# Patient Record
Sex: Female | Born: 1960 | Race: Black or African American | Hispanic: No | Marital: Married | Smoking: Never smoker
Health system: Southern US, Community
[De-identification: ages and names within clinical notes are randomized; demographics above are authoritative.]

## PROBLEM LIST (undated history)

## (undated) DIAGNOSIS — M7500 Adhesive capsulitis of unspecified shoulder: Secondary | ICD-10-CM

## (undated) DIAGNOSIS — F419 Anxiety disorder, unspecified: Secondary | ICD-10-CM

## (undated) DIAGNOSIS — T7840XA Allergy, unspecified, initial encounter: Secondary | ICD-10-CM

## (undated) HISTORY — PX: ABDOMINAL HYSTERECTOMY: SHX81

## (undated) HISTORY — DX: Adhesive capsulitis of unspecified shoulder: M75.00

## (undated) HISTORY — PX: POLYPECTOMY: SHX149

## (undated) HISTORY — DX: Anxiety disorder, unspecified: F41.9

## (undated) HISTORY — DX: Allergy, unspecified, initial encounter: T78.40XA

## (undated) HISTORY — PX: COLONOSCOPY: SHX174

---

## 2005-01-15 ENCOUNTER — Other Ambulatory Visit: Admission: RE | Admit: 2005-01-15 | Discharge: 2005-01-15 | Payer: Self-pay | Admitting: Obstetrics and Gynecology

## 2005-09-27 ENCOUNTER — Encounter (INDEPENDENT_AMBULATORY_CARE_PROVIDER_SITE_OTHER): Payer: Self-pay | Admitting: Specialist

## 2005-09-27 ENCOUNTER — Ambulatory Visit (HOSPITAL_COMMUNITY): Admission: RE | Admit: 2005-09-27 | Discharge: 2005-09-28 | Payer: Self-pay | Admitting: Obstetrics and Gynecology

## 2007-03-08 ENCOUNTER — Ambulatory Visit (HOSPITAL_BASED_OUTPATIENT_CLINIC_OR_DEPARTMENT_OTHER): Admission: RE | Admit: 2007-03-08 | Discharge: 2007-03-08 | Payer: Self-pay | Admitting: Orthopedic Surgery

## 2010-08-11 NOTE — Op Note (Signed)
Kathryn Pace, KNUPP                ACCOUNT NO.:  [id number]   MEDICAL RECORD NO.:  [id number]          PATIENT TYPE:  AMB   LOCATION:  DSC                          FACILITY:  [facility]   PHYSICIAN:  Cindee Salt, M.D.       DATE OF BIRTH:  02/20/1961   DATE OF PROCEDURE:  03/08/2007  DATE OF DISCHARGE:                               OPERATIVE REPORT   PREOPERATIVE DIAGNOSIS:  Carpal tunnel syndrome, right hand.   POSTOPERATIVE DIAGNOSIS:  Carpal tunnel syndrome, right hand.   OPERATION:  Decompression right median nerve.   SURGEON:  Dr. Merlyn Lot.   ANESTHESIA:  Forearm based IV regional.   HISTORY:  The patient is a 50 year old female with a history of carpal  tunnel syndrome, EMG nerve conductions positive and nonresponsive to  conservative treatment.  She is desirous of having this surgically  released.  She is aware of the risks and complications including  infection, recurrence, injury to arteries, nerves, tendons, incomplete  relief of symptoms, dystrophy. In the preoperative area, the patient is  seen, the extremity marked by both the patient and surgeon.  Antibiotic  given.   DESCRIPTION OF PROCEDURE:  The patient is brought to the operating room  where a forearm based IV regional anesthetic was carried out without  difficulty.  She was prepped using DuraPrep, supine position, right arm  free.  A longitudinal incision was made in the palm, carried down  through the subcutaneous tissue.  Bleeders were electrocauterized.  The  palmar fascia was split, superficial palmar arch identified, the flexor  tendon to the ring and little finger identified to the ulnar side of the  median nerve. The carpal retinaculum was incised with sharp dissection.  A right angle and Sewall retractor were placed between skin and forearm  fascia.  The fascia released for approximately a centimeter and a half  proximal to the wrist crease under direct vision.  The canal was  explored.  No further lesions  were identified.  Compression of the nerve  was immediately apparent.  The wound was irrigated.  Skin closed with  interrupted 5-0 Vicryl Rapide sutures.  A sterile compressive dressing  and splint with the fingers free was applied.  The patient tolerated the  procedure well and was taken to the recovery  room for observation in  satisfactory condition.  She is discharged home to return to the [facility] in 1 week on Vicodin.           ______________________________  Cindee Salt, M.D.     GK/MEDQ  D:  03/08/2007  T:  03/09/2007  Job:  [id number]

## 2010-08-14 NOTE — H&P (Signed)
NAMEODELLA, APPELHANS                ACCOUNT NO.:  [id number]   MEDICAL RECORD NO.:  [id number]          PATIENT TYPE:  AMB   LOCATION:  SDC                           FACILITY:  [facility]   PHYSICIAN:  Huel Cote, M.D. DATE OF BIRTH:  1960/08/30   DATE OF ADMISSION:  09/27/2005  DATE OF DISCHARGE:                                HISTORY & PHYSICAL   Dictating for surgery to take place at the [facility] facility at 9:30  a.m. on September 27, 2005.   Patient is a 50 year old G2, P1 who is coming in for a scheduled  laparoscopic-assisted vaginal hysterectomy given ongoing problems with  severe menorrhagia and flooding related to uterine fibroids.  The patient  had tried medical management with oral contraceptives, trying different  strengths, however, really received no significant relief and as well had  begun to have increasing pain with her periods.  The patient has no  significant past medical history.  She has no previous surgical history.  Gynecologically, no abnormal Pap smears.   PAST OBSTETRICAL HISTORY:  Significant for one miscarriage and one vaginal  delivery of an 8 pound 13 ounce infant.   ALLERGIES:  NONE.   MEDICATIONS:  Include Yaz birth control pills and hydrochlorothiazide for  some slightly elevated blood pressures.   PHYSICAL EXAM:  Patient's weight is 176 pounds, blood pressure 112/85.  CARDIAC EXAM:  Regular rate and rhythm.  LUNGS:  Clear.  ABDOMEN:  Soft and nontender.  PELVIC EXAM:  Patient's cervix is noted to be anterior.  Uterus is  retroverted and nodular, approximately 7-8 weeks in size, and the adnexa  have no significant masses.  Ultrasound report confirmed that the patient  had multiple uterine fibroids, intramural and possible submucosal, and her  ovaries appeared normal.  Patient was counseled as to the risks and benefits  of surgery including bleeding and infection and possible damage to bowel and  bladder and desired to proceed with  hysterectomy.  All possible options were  discussed with the patient including hysteroscopy vs. laparoscopic  supracervical hysterectomy and vaginal hysterectomy.  The patient wishes to  retain her ovaries if they are normal and desired her cervix to be removed.  Therefore, the plan was made for a laparoscopic assisted vaginal  hysterectomy given that the patient's uterine descensus is fair and  considerably retroverted.  All risks and benefits of the surgery were  discussed with the patient in detail including bleeding and infection and  possible damage to bowel and bladder.  The patient understands that should  any complications arise such as adjacent organ  damage or bleeding that she may require an abdominal incision, which will  require a longer healing process, and she understands the situation and  agrees that that should be performed in that event.  After all of these  options and risks and benefits were reviewed with the patient, she desired  to proceed as stated.      Huel Cote, M.D.  Electronically Signed     KR/MEDQ  D:  09/25/2005  T:  09/25/2005  Job:  [id number]

## 2010-08-14 NOTE — Op Note (Signed)
NAMELILYA, SMITHERMAN                ACCOUNT NO.:  [id number]   MEDICAL RECORD NO.:  [id number]          PATIENT TYPE:  OIB   LOCATION:  9315                          FACILITY:  [facility]   PHYSICIAN:  Huel Cote, M.D. DATE OF BIRTH:  Apr 14, 1960   DATE OF PROCEDURE:  09/27/2005  DATE OF DISCHARGE:                                 OPERATIVE REPORT   PREOPERATIVE DIAGNOSIS:  1.  Menorrhagia.  2.  Fibroid uterus.   POSTOPERATIVE DIAGNOSIS:  1.  Menorrhagia.  2.  Fibroid uterus.   PROCEDURE:  1.  Laparoscopic-assisted vaginal hysterectomy.  2.  Cystoscopy.   SURGEON:  Dr. Huel Cote.   ASSISTANT:  Zenaida Niece, M.D.   ANESTHESIA:  General.   SPECIMENS:  Uterus and cervix were sent.   ESTIMATED BLOOD LOSS:  400 mL.   URINE OUTPUT:  400 mL.   IV FLUIDS:  2400 mL LR.   FINDINGS:  The uterus was 10 weeks in size, approximately 190 grams. Ovaries  and tubes were normal.  There were multiple fibroids noted.   SPECIMEN:  The uterus was sent to pathology.   PROCEDURE:  The patient was taken to the operating room where general  anesthesia was obtained without difficulty.  She was then prepped and draped  in normal sterile fashion in dorsal lithotomy position with a speculum  placed. Hulka tenaculum was placed within the cervix for uterine  manipulation and the Foley catheter was placed in the bladder.  Attention  was then turned to the patient's abdomen where after injection with quarter  percent Marcaine a small infraumbilical incision was made with scalpel and  the Veress needle introduced intraperitoneal placement was confirmed with  both aspiration and injection with normal saline. Gas flow was then applied  and normal pressure noted at approximately 4 therefore pneumoperitoneum was  obtained with approximately 2.5 liters CO2 gas. Two additional trocars were  placed at each mid quadrant.  After injection with quarter percent Marcaine  under direct visualization.  With 5 mm trocars in place.  Attention was then  turned to the uterus itself.  The ovaries and tubes were inspected and found  to be normal.  Therefore the harmonic scalpel was utilized to take down the  utero-ovarian ligament as well as the fallopian tube and broad ligament and  round ligament on the patient's left. There was small area of bleeding noted  which was controlled with harmonic scalpel. Attention was then turned to the  patient's right where again the utero-ovarian and the round ligament and  fallopian tube as well as the broad ligament were taken down with harmonic  scalpel.  This was taken down to the level of the bladder flap which was  created across the midline.  There was slow trickle of bleeding that had  continued near the uterine artery side on the patient's left however, it was  very slow trickle and therefore as much hemostasis could be obtained was  obtained and no active bleeding noted. At this point decision was made to go  to the vaginal approach.  All instruments were removed  from the trocars and  they were left in place.  These were covered and the patient legs elevated.  With a weighted speculum in place, a Jacobson tenaculums were placed.  The  Hulka tenaculum and pulled cervix downward in traction. Solution of dilute  Pitressin 20 units in 100 mL was then injected around the cervix and the  cervix was then incised circumferentially with the Bovie cautery.  The Mayo  scissors were then utilized to dissect the vaginal mucosa off the underlying  cervix.  The posterior cul-de-sac was entered sharply with Mayo scissors and  a small clot evacuated. Attention was then turned to the right uterosacral  ligament which was clamped with Zeppelin clamp, transected and suture  ligated with 0 Vicryl.  A similar fashion on the patient's right the  uterosacral ligament was clamped, transected and suture ligated. The  anterior cul-de-sac was then identified and entered  bluntly.  With the  banana speculum then in the posterior cul-de-sac and a Deaver retractor in  the anterior cul-de-sac.  The uterus was isolated. The remainder of the  pedicle on the patient's left was then clamped, transected and suture  ligated with 0 Vicryl.  There was some bleeding noted to appear to be coming  from the patient's left. This was felt to be due more to the uterus back  bleeding as the arteries had not been clamped on the patient's right.  Therefore the right paracervical tissue and uterine artery were sequentially  clamped with Zeppelin clamp, transected and suture ligated.  This did appear  to improve the bleeding.  This was continued until the pedicles were  completely transected. The uterus itself was then delivered and handed off  to pathology. There was small amount of bleeding still noted on either side  which was controlled with figure-of-eight sutures of 0 Vicryl and a figure-  of-eight suture of 3-0 Vicryl. These were mostly at the angles of the cuff.  This was then well-controlled and no active bleeding noted.  Therefore the  Bonanno speculum was removed from the patient's vagina and the posterior  cuff was identified as it was bleeding some. It was suture ligated in a  running locked suture along the posterior cuff to gain hemostasis with 0  Vicryl.  This did appear to gain hemostasis of the posterior cuff. The  uterosacral ligaments were then reapproximated with 0 Vicryl in an  interrupted suture and all sutures trimmed. The vaginal cuff itself was then  closed with 2-0 Vicryl in a running locked suture.  At this point, excellent  hemostasis was noted.  The decision was made to proceed with cystoscopy just  given the bleeding that had been encountered to ensure that the ureters had  not been compromised with controlling any bleeding. Indigo carmine had been  given IV and the cystoscopy 7 degrees scope was introduced into the bladder with the bladder drained.   The ureteral orifices were both clearly  identified and had normal ureteral jets noted coming from each side. Bladder  itself appeared traumatized and there were no sutures apparent.  Therefore  the cystoscope was removed and the Foley catheter replaced into the urethra.  At this point the attention was then returned to the patient's abdomen.  The  gas flow was then reapplied and pneumoperitoneum obtained.  There was some  old blood clot noted in the pelvis which was irrigated and all pedicles were  irrigated.  The ovarian pedicles appeared hemostatic.  There is no active  bleeding at the cuff.  Therefore all instruments and sponges were removed  from the patient's abdomen.  The 5 mm trocars were removed under direct  visualization.  The pneumoperitoneum reduced and a 10:11 removed. The 10/11  trocar site was then closed with one deep suture of 0 Vicryl and  subcuticular stitch of 3-0 Vicryl and the 5 mm sites were closed with one  suture each of mattress suture of 3-0 Vicryl.  Sponge, lap and needle counts  were correct x2 and the patient was taken to the recovery room extubated in  stable condition.      Huel Cote, M.D.  Electronically Signed     KR/MEDQ  D:  09/27/2005  T:  09/27/2005  Job:  [id number]

## 2010-09-21 ENCOUNTER — Other Ambulatory Visit: Payer: Self-pay | Admitting: Obstetrics and Gynecology

## 2010-09-21 DIAGNOSIS — Z1231 Encounter for screening mammogram for malignant neoplasm of breast: Secondary | ICD-10-CM

## 2010-09-24 ENCOUNTER — Ambulatory Visit: Payer: Self-pay

## 2010-09-29 ENCOUNTER — Ambulatory Visit: Payer: Self-pay

## 2010-10-29 ENCOUNTER — Ambulatory Visit
Admission: RE | Admit: 2010-10-29 | Discharge: 2010-10-29 | Disposition: A | Discharge status: Home / Self Care | Payer: BC Managed Care – PPO | Source: Ambulatory Visit | Attending: Obstetrics and Gynecology | Admitting: Obstetrics and Gynecology

## 2010-10-29 DIAGNOSIS — Z1231 Encounter for screening mammogram for malignant neoplasm of breast: Secondary | ICD-10-CM

## 2011-01-04 LAB — POCT HEMOGLOBIN-HEMACUE: Operator id: 208731

## 2011-11-03 ENCOUNTER — Other Ambulatory Visit: Payer: Self-pay | Admitting: Obstetrics and Gynecology

## 2011-11-03 DIAGNOSIS — Z1231 Encounter for screening mammogram for malignant neoplasm of breast: Secondary | ICD-10-CM

## 2011-11-09 ENCOUNTER — Ambulatory Visit: Payer: BC Managed Care – PPO

## 2011-12-06 ENCOUNTER — Ambulatory Visit
Admission: RE | Admit: 2011-12-06 | Discharge: 2011-12-06 | Disposition: A | Discharge status: Home / Self Care | Payer: BC Managed Care – PPO | Source: Ambulatory Visit | Attending: Obstetrics and Gynecology | Admitting: Obstetrics and Gynecology

## 2011-12-06 DIAGNOSIS — Z1231 Encounter for screening mammogram for malignant neoplasm of breast: Secondary | ICD-10-CM

## 2013-02-02 ENCOUNTER — Other Ambulatory Visit: Payer: Self-pay

## 2013-02-02 DIAGNOSIS — Z1231 Encounter for screening mammogram for malignant neoplasm of breast: Secondary | ICD-10-CM

## 2013-02-05 ENCOUNTER — Ambulatory Visit
Admission: RE | Admit: 2013-02-05 | Discharge: 2013-02-05 | Disposition: A | Discharge status: Home / Self Care | Payer: BC Managed Care – PPO | Source: Ambulatory Visit

## 2013-02-05 DIAGNOSIS — Z1231 Encounter for screening mammogram for malignant neoplasm of breast: Secondary | ICD-10-CM

## 2014-02-13 ENCOUNTER — Other Ambulatory Visit: Payer: Self-pay

## 2014-02-13 DIAGNOSIS — Z1231 Encounter for screening mammogram for malignant neoplasm of breast: Secondary | ICD-10-CM

## 2014-02-28 ENCOUNTER — Ambulatory Visit
Admission: RE | Admit: 2014-02-28 | Discharge: 2014-02-28 | Disposition: A | Discharge status: Home / Self Care | Payer: BC Managed Care – PPO | Source: Ambulatory Visit

## 2014-02-28 ENCOUNTER — Encounter (INDEPENDENT_AMBULATORY_CARE_PROVIDER_SITE_OTHER): Payer: Self-pay

## 2014-02-28 DIAGNOSIS — Z1231 Encounter for screening mammogram for malignant neoplasm of breast: Secondary | ICD-10-CM

## 2015-05-19 ENCOUNTER — Other Ambulatory Visit: Payer: Self-pay

## 2015-05-19 DIAGNOSIS — Z1231 Encounter for screening mammogram for malignant neoplasm of breast: Secondary | ICD-10-CM

## 2015-06-03 ENCOUNTER — Ambulatory Visit
Admission: RE | Admit: 2015-06-03 | Discharge: 2015-06-03 | Disposition: A | Discharge status: Home / Self Care | Payer: BC Managed Care – PPO | Source: Ambulatory Visit

## 2015-06-03 DIAGNOSIS — Z1231 Encounter for screening mammogram for malignant neoplasm of breast: Secondary | ICD-10-CM

## 2016-07-26 ENCOUNTER — Other Ambulatory Visit: Payer: Self-pay | Admitting: Obstetrics and Gynecology

## 2016-07-26 DIAGNOSIS — Z1231 Encounter for screening mammogram for malignant neoplasm of breast: Secondary | ICD-10-CM

## 2016-07-28 ENCOUNTER — Ambulatory Visit
Admission: RE | Admit: 2016-07-28 | Discharge: 2016-07-28 | Disposition: A | Discharge status: Home / Self Care | Payer: BC Managed Care – PPO | Source: Ambulatory Visit | Attending: Obstetrics and Gynecology | Admitting: Obstetrics and Gynecology

## 2016-07-28 DIAGNOSIS — Z1231 Encounter for screening mammogram for malignant neoplasm of breast: Secondary | ICD-10-CM

## 2017-08-03 ENCOUNTER — Other Ambulatory Visit: Payer: Self-pay | Admitting: Obstetrics and Gynecology

## 2017-08-03 DIAGNOSIS — Z1231 Encounter for screening mammogram for malignant neoplasm of breast: Secondary | ICD-10-CM

## 2017-08-24 ENCOUNTER — Ambulatory Visit
Admission: RE | Admit: 2017-08-24 | Discharge: 2017-08-24 | Disposition: A | Discharge status: Home / Self Care | Payer: BC Managed Care – PPO | Source: Ambulatory Visit | Attending: Obstetrics and Gynecology | Admitting: Obstetrics and Gynecology

## 2017-08-24 ENCOUNTER — Other Ambulatory Visit: Payer: Self-pay | Admitting: Obstetrics and Gynecology

## 2017-08-24 DIAGNOSIS — Z1231 Encounter for screening mammogram for malignant neoplasm of breast: Secondary | ICD-10-CM

## 2020-01-22 ENCOUNTER — Other Ambulatory Visit: Payer: Self-pay | Admitting: Obstetrics and Gynecology

## 2020-01-22 DIAGNOSIS — Z1231 Encounter for screening mammogram for malignant neoplasm of breast: Secondary | ICD-10-CM

## 2020-01-31 ENCOUNTER — Other Ambulatory Visit: Payer: Self-pay

## 2020-01-31 ENCOUNTER — Telehealth: Payer: Self-pay

## 2020-01-31 ENCOUNTER — Ambulatory Visit (AMBULATORY_SURGERY_CENTER): Payer: Self-pay

## 2020-01-31 VITALS — Ht 66.0 in | Wt 224.8 lb

## 2020-01-31 DIAGNOSIS — Z86010 Personal history of colonic polyps: Secondary | ICD-10-CM

## 2020-01-31 MED ORDER — CLENPIQ 10-3.5-12 MG-GM -GM/160ML PO SOLN: 1.0000 | Freq: Once | ORAL | 0 refills | Status: AC

## 2020-01-31 NOTE — Progress Notes (Signed)
No allergies to soy or egg Pt is not on blood thinners or diet pills Denies issues with sedation/intubation Denies atrial flutter/fib Denies constipation   Emmi instructions given to pt  Pt is aware of Covid safety and care partner requirements.   Denies any issues with kidney functions or HTN.

## 2020-01-31 NOTE — Telephone Encounter (Signed)
In closing chart it was noted that local drug store not confirmed.  Attempted to reach pt to get this information with call going to VM.  I requested pt to call back with that information.

## 2020-02-08 ENCOUNTER — Other Ambulatory Visit: Payer: Self-pay

## 2020-02-08 ENCOUNTER — Ambulatory Visit (AMBULATORY_SURGERY_CENTER): Payer: BC Managed Care – PPO | Admitting: Gastroenterology

## 2020-02-08 ENCOUNTER — Encounter: Payer: Self-pay | Admitting: Gastroenterology

## 2020-02-08 VITALS — BP 132/83 | HR 86 | Temp 97.0°F | Resp 17 | Ht 66.0 in | Wt 224.8 lb

## 2020-02-08 DIAGNOSIS — Z86010 Personal history of colonic polyps: Secondary | ICD-10-CM | POA: Diagnosis not present

## 2020-02-08 DIAGNOSIS — Z1211 Encounter for screening for malignant neoplasm of colon: Secondary | ICD-10-CM

## 2020-02-08 MED ORDER — SODIUM CHLORIDE 0.9 % IV SOLN: 500.0000 mL | INTRAVENOUS | Status: DC

## 2020-02-08 NOTE — Progress Notes (Signed)
Pt's states no medical or surgical changes since previsit or office visit. 

## 2020-02-08 NOTE — Op Note (Signed)
[facility] Patient Name: Kathryn Pace Procedure Date: 02/08/2020 12:51 PM MRN: [id number] Endoscopist: Lynann Bologna , MD Age: 59 Referring MD:  Date of Birth: 1960/10/17 Gender: Female Account #: [id number] Procedure:                Colonoscopy Indications:              Colon cancer screening in patient at increased                            risk: Family history of colonic polyps. Medicines:                Monitored Anesthesia Care Procedure:                Pre-Anesthesia Assessment:                           - Prior to the procedure, a History and Physical                            was performed, and patient medications and                            allergies were reviewed. The patient's tolerance of                            previous anesthesia was also reviewed. The risks                            and benefits of the procedure and the sedation                            options and risks were discussed with the patient.                            All questions were answered, and informed consent                            was obtained. Prior Anticoagulants: The patient has                            taken no previous anticoagulant or antiplatelet                            agents. ASA Grade Assessment: II - A patient with                            mild systemic disease. After reviewing the risks                            and benefits, the patient was deemed in                            satisfactory condition to undergo the procedure.  After obtaining informed consent, the colonoscope                            was passed under direct vision. Throughout the                            procedure, the patient's blood pressure, pulse, and                            oxygen saturations were monitored continuously. The                            Colonoscope was introduced through the anus and                            advanced to the 2 cm  into the ileum. The                            colonoscopy was performed without difficulty. The                            patient tolerated the procedure well. The quality                            of the bowel preparation was good. The terminal                            ileum, ileocecal valve, appendiceal orifice, and                            rectum were photographed. Scope In: 1:38:49 PM Scope Out: 1:52:43 PM Scope Withdrawal Time: 0 hours 9 minutes 24 seconds  Total Procedure Duration: 0 hours 13 minutes 54 seconds  Findings:                 Multiple small-mouthed diverticula were found in                            the sigmoid colon and ascending colon.                           Non-bleeding internal hemorrhoids were found during                            retroflexion. The hemorrhoids were small.                           The terminal ileum appeared normal.                           The exam was otherwise without abnormality on                            direct and retroflexion views. Complications:            No immediate  complications. Estimated Blood Loss:     Estimated blood loss: none. Impression:               -Pancolonic diverticulosis predominantly in the                            sigmoid colon.                           -Otherwise normal colonoscopy to TI.                           -No specimens collected. Recommendation:           - Patient has a contact number available for                            emergencies. The signs and symptoms of potential                            delayed complications were discussed with the                            patient. Return to normal activities tomorrow.                            Written discharge instructions were provided to the                            patient.                           - Resume previous diet.                           - Continue present medications.                           - Repeat colonoscopy in 10  years for screening                            purposes. Earlier, if with any new problems or if                            there is any change in family history.                           - Return to GI office PRN. Lynann Bologna, MD 02/08/2020 1:58:40 PM This report has been signed electronically.

## 2020-02-08 NOTE — Patient Instructions (Signed)
HANDOUTS PROVIDED ON: DIVERTICULOSIS & HEMORRHOIDS   You may resume your previous diet and medication schedule.  Thank you for allowing us to care for you today!!!  YOU HAD AN ENDOSCOPIC PROCEDURE TODAY AT THE [facility]:   Refer to the procedure report that was given to you for any specific questions about what was found during the examination.  If the procedure report does not answer your questions, please call your gastroenterologist to clarify.  If you requested that your care partner not be given the details of your procedure findings, then the procedure report has been included in a sealed envelope for you to review at your convenience later.  YOU SHOULD EXPECT: Some feelings of bloating in the abdomen. Passage of more gas than usual.  Walking can help get rid of the air that was put into your GI tract during the procedure and reduce the bloating. If you had a lower endoscopy (such as a colonoscopy or flexible sigmoidoscopy) you may notice spotting of blood in your stool or on the toilet paper. If you underwent a bowel prep for your procedure, you may not have a normal bowel movement for a few days.  Please Note:  You might notice some irritation and congestion in your nose or some drainage.  This is from the oxygen used during your procedure.  There is no need for concern and it should clear up in a day or so.  SYMPTOMS TO REPORT IMMEDIATELY:   Following lower endoscopy (colonoscopy or flexible sigmoidoscopy):  Excessive amounts of blood in the stool  Significant tenderness or worsening of abdominal pains  Swelling of the abdomen that is new, acute  Fever of 100F or higher  For urgent or emergent issues, a gastroenterologist can be reached at any hour by calling [phone]. Do not use MyChart messaging for urgent concerns.    DIET:  We do recommend a small meal at first, but then you may proceed to your regular diet.  Drink plenty of fluids but you should avoid  alcoholic beverages for 24 hours.  ACTIVITY:  You should plan to take it easy for the rest of today and you should NOT DRIVE or use heavy machinery until tomorrow (because of the sedation medicines used during the test).    FOLLOW UP: Our staff will call the number listed on your records Tuesday morning between 7:15am and 8:15 am to check on you and address any questions or concerns that you may have regarding the information given to you following your procedure. If we do not reach you, we will leave a message.  We will attempt to reach you two times.  During this call, we will ask if you have developed any symptoms of COVID 19. If you develop any symptoms (ie: fever, flu-like symptoms, shortness of breath, cough etc.) before then, please call [phone].  If you test positive for Covid 19 in the 2 weeks post procedure, please call and report this information to us.    If any biopsies were taken you will be contacted by phone or by letter within the next 1-3 weeks.  Please call us at [phone] if you have not heard about the biopsies in 3 weeks.    SIGNATURES/CONFIDENTIALITY: You and/or your care partner have signed paperwork which will be entered into your electronic medical record.  These signatures attest to the fact that that the information above on your After Visit Summary has been reviewed and is understood.  Full responsibility of   of the confidentiality of this discharge information lies with you and/or your care-partner.

## 2020-02-12 ENCOUNTER — Telehealth: Payer: Self-pay | Admitting: *Deleted

## 2020-02-12 NOTE — Telephone Encounter (Signed)
°  Follow up Call-  Call back number 02/08/2020  Post procedure Call Back phone  # [phone]  Permission to leave phone message Yes  Some recent data might be hidden     Patient questions:  Do you have a fever, pain , or abdominal swelling? No. Pain Score  0 *  Have you tolerated food without any problems? Yes.    Have you been able to return to your normal activities? Yes.    Do you have any questions about your discharge instructions: Diet   No. Medications  No. Follow up visit  No.  Do you have questions or concerns about your Care? No.  Actions: * If pain score is 4 or above: No action needed, pain <4  1. Have you developed a fever since your procedure? NO  2.   Have you had an respiratory symptoms (SOB or cough) since your procedure? NO  3.   Have you tested positive for COVID 19 since your procedure NO  4.   Have you had any family members/close contacts diagnosed with the COVID 19 since your procedure? NO   If yes to any of these questions please route to Laverna Peace, RN and Karlton Lemon, RN

## 2020-02-28 ENCOUNTER — Ambulatory Visit: Payer: BC Managed Care – PPO

## 2020-04-21 ENCOUNTER — Ambulatory Visit: Payer: BC Managed Care – PPO

## 2020-04-24 ENCOUNTER — Ambulatory Visit
Admission: RE | Admit: 2020-04-24 | Discharge: 2020-04-24 | Disposition: A | Discharge status: Home / Self Care | Payer: BC Managed Care – PPO | Source: Ambulatory Visit | Attending: Obstetrics and Gynecology | Admitting: Obstetrics and Gynecology

## 2020-04-24 ENCOUNTER — Other Ambulatory Visit: Payer: Self-pay

## 2020-04-24 DIAGNOSIS — Z1231 Encounter for screening mammogram for malignant neoplasm of breast: Secondary | ICD-10-CM

## 2021-06-05 ENCOUNTER — Other Ambulatory Visit: Payer: Self-pay | Admitting: Obstetrics and Gynecology

## 2021-06-05 DIAGNOSIS — Z1231 Encounter for screening mammogram for malignant neoplasm of breast: Secondary | ICD-10-CM

## 2021-06-12 ENCOUNTER — Ambulatory Visit
Admission: RE | Admit: 2021-06-12 | Discharge: 2021-06-12 | Disposition: A | Discharge status: Home / Self Care | Payer: BC Managed Care – PPO | Source: Ambulatory Visit | Attending: Obstetrics and Gynecology | Admitting: Obstetrics and Gynecology

## 2021-06-12 DIAGNOSIS — Z1231 Encounter for screening mammogram for malignant neoplasm of breast: Secondary | ICD-10-CM

## 2021-09-09 ENCOUNTER — Other Ambulatory Visit: Payer: Self-pay

## 2021-09-09 NOTE — Progress Notes (Signed)
Pt completed pre-employment uds. HR notified. ?

## 2022-07-02 ENCOUNTER — Other Ambulatory Visit: Payer: Self-pay | Admitting: Obstetrics and Gynecology

## 2022-07-02 DIAGNOSIS — Z1231 Encounter for screening mammogram for malignant neoplasm of breast: Secondary | ICD-10-CM

## 2022-07-05 ENCOUNTER — Ambulatory Visit
Admission: RE | Admit: 2022-07-05 | Discharge: 2022-07-05 | Disposition: A | Discharge status: Home / Self Care | Payer: BC Managed Care – PPO | Source: Ambulatory Visit | Attending: Obstetrics and Gynecology | Admitting: Obstetrics and Gynecology

## 2022-07-05 DIAGNOSIS — Z1231 Encounter for screening mammogram for malignant neoplasm of breast: Secondary | ICD-10-CM

## 2023-06-29 ENCOUNTER — Other Ambulatory Visit: Payer: Self-pay | Admitting: Obstetrics and Gynecology

## 2023-06-29 DIAGNOSIS — Z1231 Encounter for screening mammogram for malignant neoplasm of breast: Secondary | ICD-10-CM

## 2023-07-08 ENCOUNTER — Ambulatory Visit
Admission: RE | Admit: 2023-07-08 | Discharge: 2023-07-08 | Disposition: A | Discharge status: Home / Self Care | Payer: Self-pay | Source: Ambulatory Visit | Attending: Obstetrics and Gynecology | Admitting: Obstetrics and Gynecology

## 2023-07-08 DIAGNOSIS — Z1231 Encounter for screening mammogram for malignant neoplasm of breast: Secondary | ICD-10-CM

## 2023-09-04 IMAGING — MG MM DIGITAL SCREENING BILAT W/ TOMO AND CAD
8 series · 8 of 24 positions shown · non-contrast
Comparison: Previous exam(s).

CLINICAL DATA: Screening.

EXAM:
DIGITAL SCREENING BILATERAL MAMMOGRAM WITH TOMOSYNTHESIS AND CAD
TECHNIQUE: Bilateral screening digital craniocaudal and mediolateral oblique
mammograms were obtained. Bilateral screening digital breast
tomosynthesis was performed. The images were evaluated with
computer-aided detection.

[R MLO synth-2D]
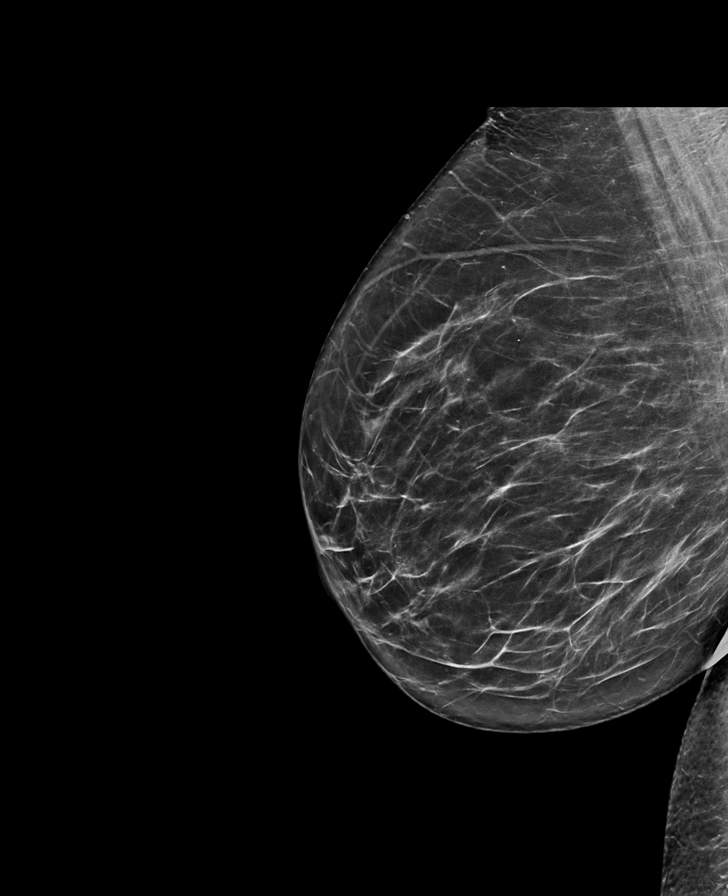

[L MLO synth-2D]
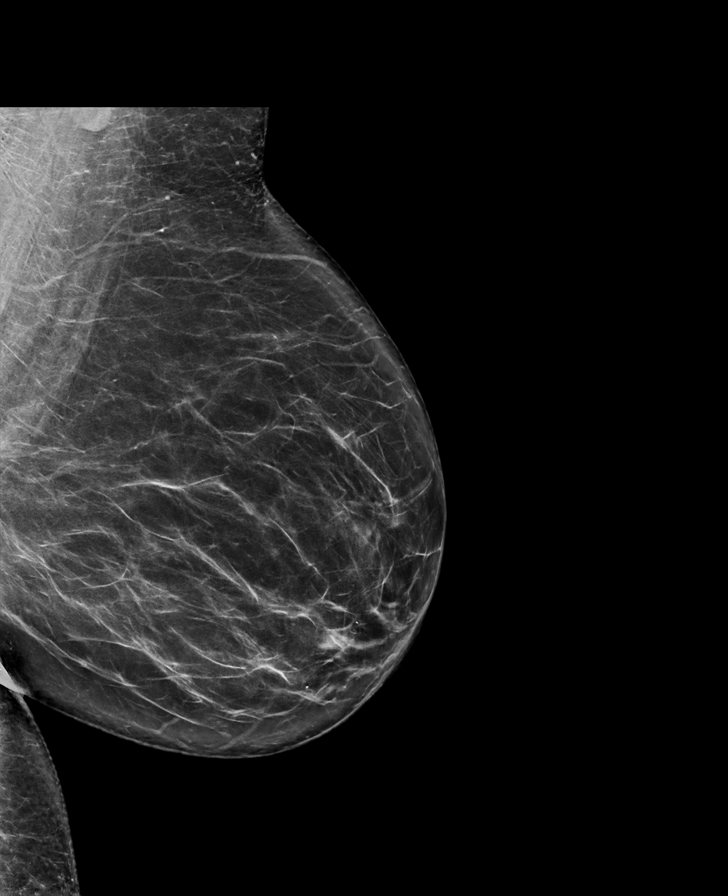

[L CC synth-2D]
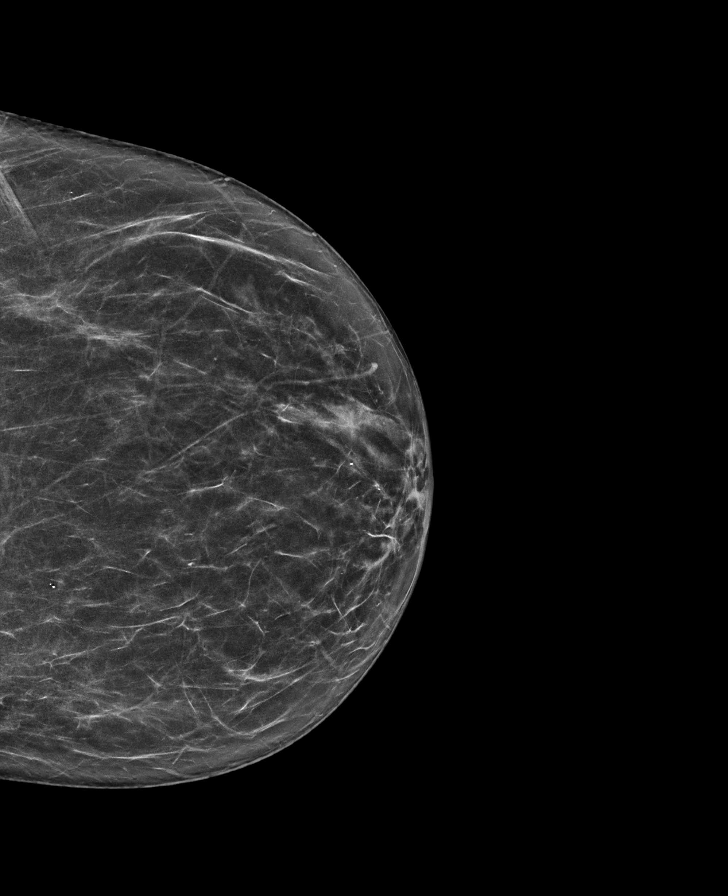

[R CC synth-2D]
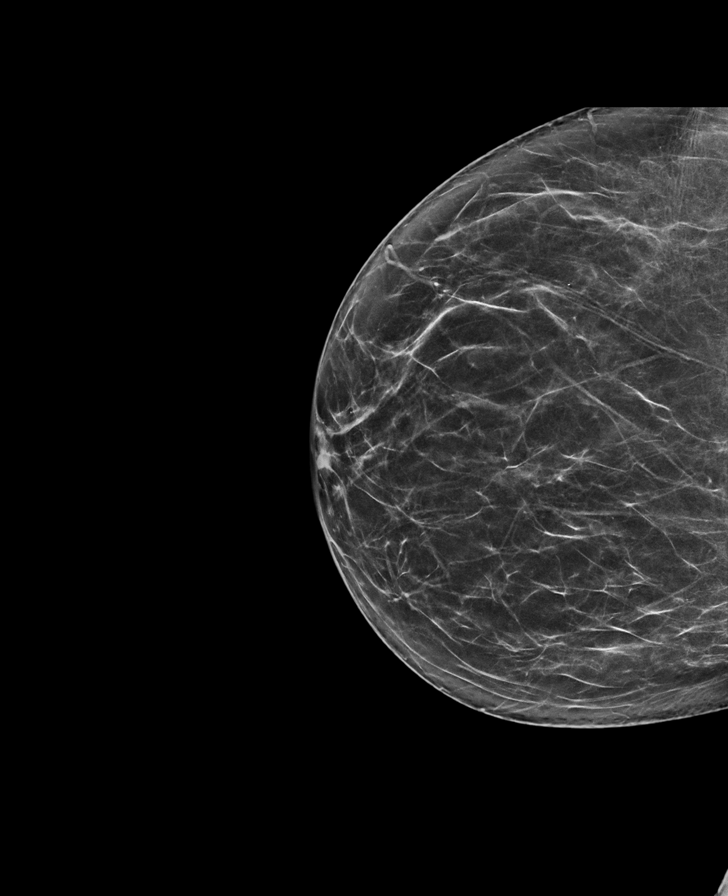

[L CC tomo · tomo slice 33/66.0]
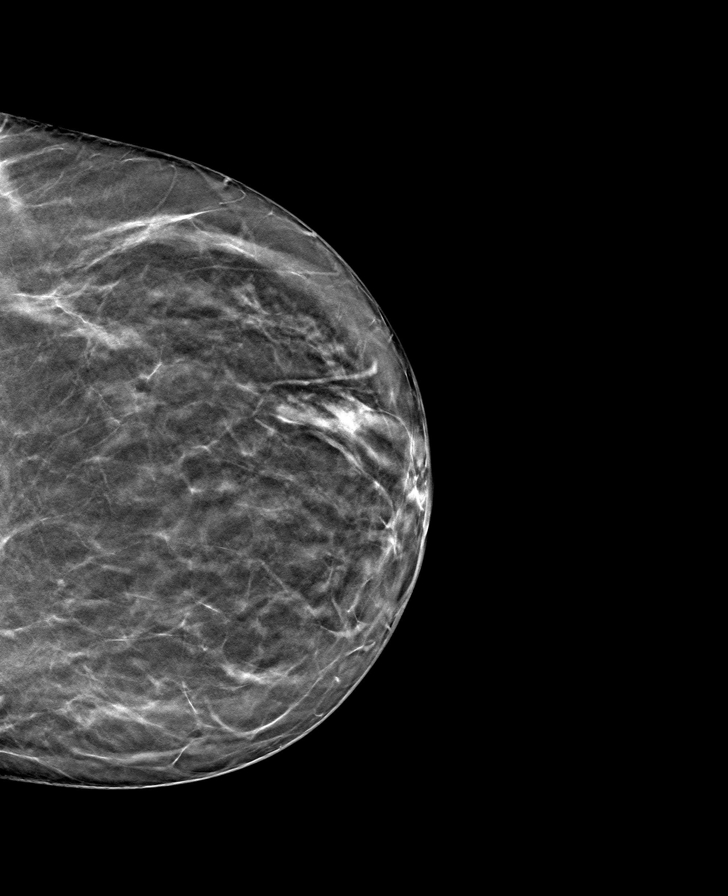

[R MLO tomo · tomo slice 37/74.0]
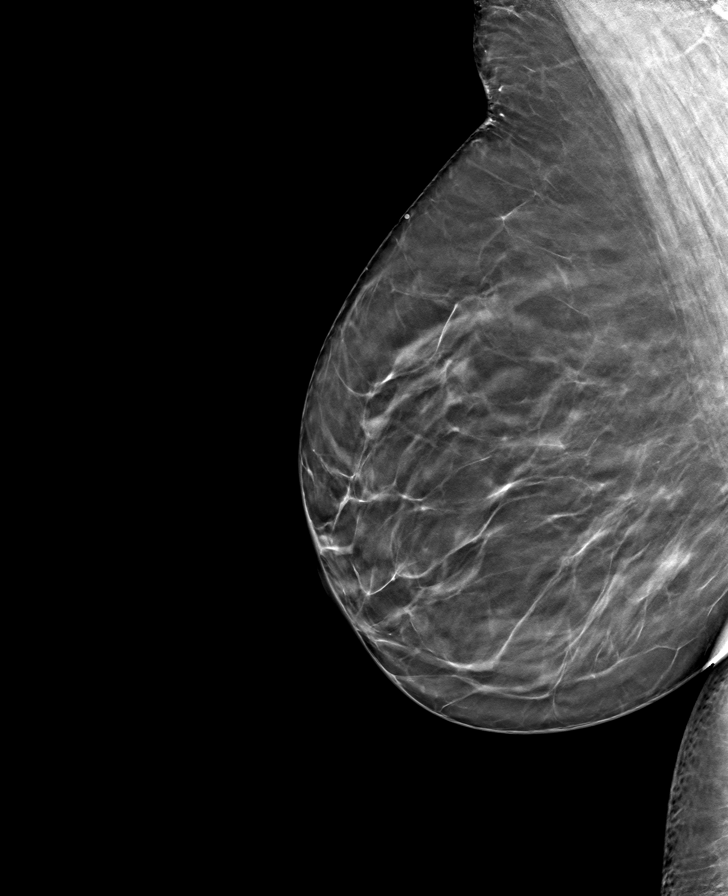

[R CC tomo · tomo slice 35/69.0]
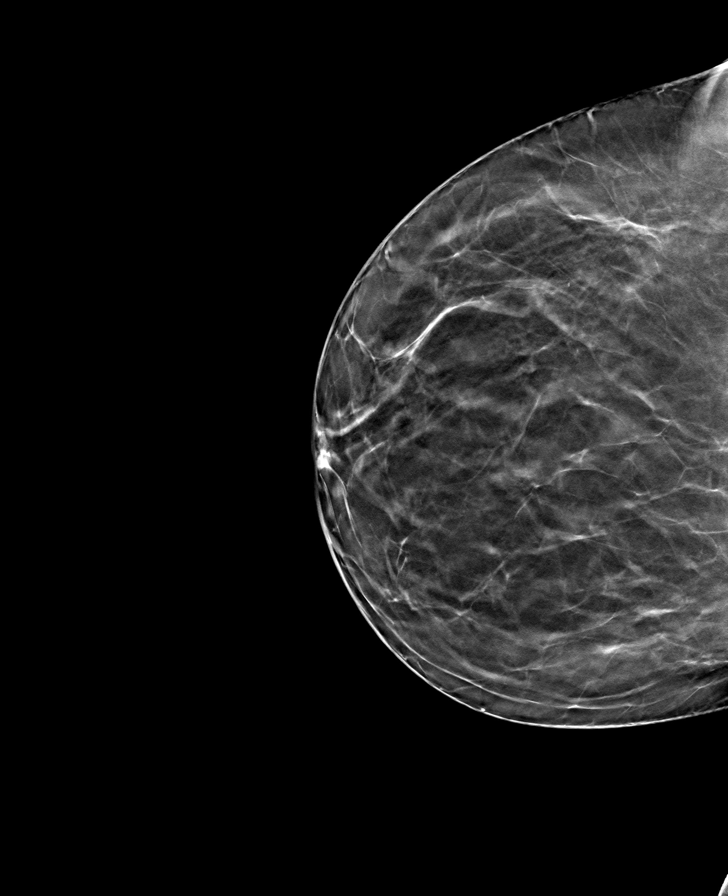

[L MLO tomo · tomo slice 43/86.0]
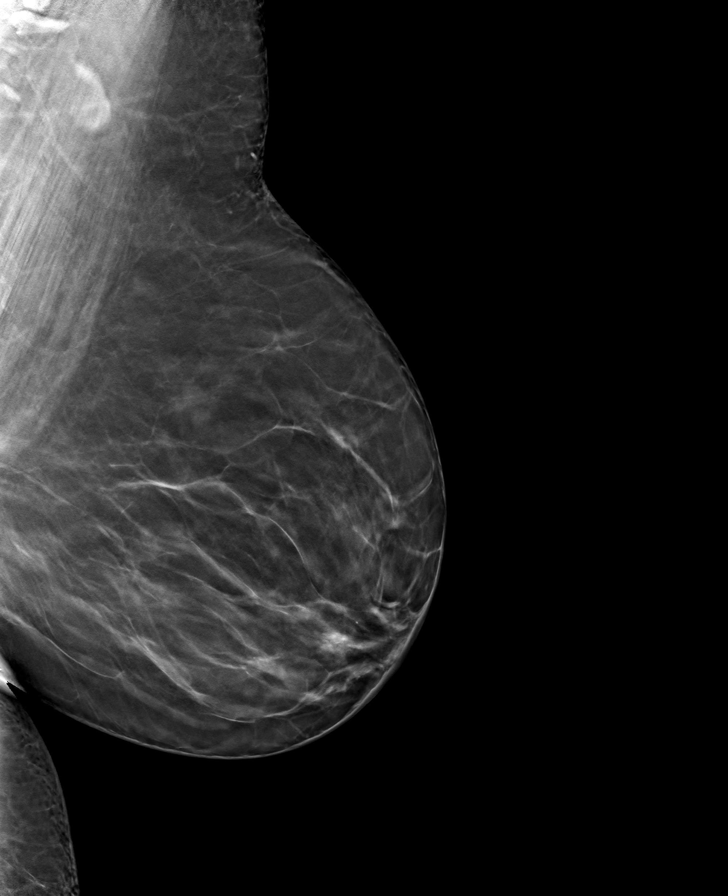

[8 of 24 positions shown; findings below may reference images not displayed]

ACR Breast Density Category b: There are scattered areas of
fibroglandular density.
FINDINGS: There are no findings suspicious for malignancy.
IMPRESSION: No mammographic evidence of malignancy. A result letter of this
screening mammogram will be mailed directly to the patient.

RECOMMENDATION:
Screening mammogram in one year. (Code:51-O-LD2)

BI-RADS CATEGORY  1: Negative.

## 2023-11-01 ENCOUNTER — Other Ambulatory Visit: Payer: Self-pay

## 2023-11-01 DIAGNOSIS — I872 Venous insufficiency (chronic) (peripheral): Secondary | ICD-10-CM

## 2023-12-01 ENCOUNTER — Ambulatory Visit (HOSPITAL_COMMUNITY): Payer: Self-pay

## 2024-03-27 ENCOUNTER — Ambulatory Visit (HOSPITAL_COMMUNITY): Payer: Self-pay | Attending: Vascular Surgery | Admitting: Vascular Surgery

## 2024-03-27 ENCOUNTER — Encounter (HOSPITAL_COMMUNITY)

## 2024-05-28 ENCOUNTER — Ambulatory Visit (HOSPITAL_COMMUNITY)
